# Patient Record
Sex: Female | Born: 1990 | Hispanic: Yes | Marital: Single
Health system: Southern US, Community
[De-identification: ages and names within clinical notes are randomized; demographics above are authoritative.]

## PROBLEM LIST (undated history)

## (undated) HISTORY — PX: APPENDECTOMY: SHX54

## (undated) HISTORY — PX: CHOLECYSTECTOMY: SHX55

---

## 2018-08-26 ENCOUNTER — Encounter (HOSPITAL_COMMUNITY): Payer: Self-pay | Admitting: Emergency Medicine

## 2018-08-26 ENCOUNTER — Emergency Department (HOSPITAL_COMMUNITY): Payer: Medicaid - Out of State

## 2018-08-26 ENCOUNTER — Emergency Department (HOSPITAL_COMMUNITY)
Admission: EM | Admit: 2018-08-26 | Discharge: 2018-08-26 | Disposition: A | Discharge status: Home / Self Care | Payer: Medicaid - Out of State | Attending: Emergency Medicine | Admitting: Emergency Medicine

## 2018-08-26 DIAGNOSIS — R197 Diarrhea, unspecified: Secondary | ICD-10-CM | POA: Insufficient documentation

## 2018-08-26 DIAGNOSIS — R1013 Epigastric pain: Secondary | ICD-10-CM

## 2018-08-26 DIAGNOSIS — R112 Nausea with vomiting, unspecified: Secondary | ICD-10-CM | POA: Diagnosis not present

## 2018-08-26 LAB — COMPREHENSIVE METABOLIC PANEL
ALK PHOS: 92 U/L (ref 38–126)
ALT: 26 U/L (ref 0–44)
AST: 21 U/L (ref 15–41)
Albumin: 4 g/dL (ref 3.5–5.0)
Anion gap: 10 (ref 5–15)
BUN: 13 mg/dL (ref 6–20)
CO2: 18 mmol/L — ABNORMAL LOW (ref 22–32)
Calcium: 8.9 mg/dL (ref 8.9–10.3)
Chloride: 108 mmol/L (ref 98–111)
Creatinine, Ser: 0.78 mg/dL (ref 0.44–1.00)
GFR calc Af Amer: >60 mL/min (ref >60)
GFR calc non Af Amer: >60 mL/min (ref >60)
Glucose, Bld: 134 mg/dL — ABNORMAL HIGH (ref 70–99)
Potassium: 3.8 mmol/L (ref 3.5–5.1)
Sodium: 136 mmol/L (ref 135–145)
Total Bilirubin: 0.5 mg/dL (ref 0.3–1.2)
Total Protein: 7.5 g/dL (ref 6.5–8.1)

## 2018-08-26 LAB — CBC
HCT: 39.6 % (ref 36.0–46.0)
Hemoglobin: 11.6 g/dL — ABNORMAL LOW (ref 12.0–15.0)
MCH: 22 pg — AB (ref 26.0–34.0)
MCHC: 29.3 g/dL — ABNORMAL LOW (ref 30.0–36.0)
MCV: 75 fL — AB (ref 80.0–100.0)
NRBC: 0 % (ref 0.0–0.2)
Platelets: 366 10*3/uL (ref 150–400)
RBC: 5.28 MIL/uL — ABNORMAL HIGH (ref 3.87–5.11)
RDW: 17 % — ABNORMAL HIGH (ref 11.5–15.5)
WBC: 14.9 10*3/uL — ABNORMAL HIGH (ref 4.0–10.5)

## 2018-08-26 LAB — URINALYSIS, ROUTINE W REFLEX MICROSCOPIC
Bilirubin Urine: NEGATIVE
Glucose, UA: NEGATIVE mg/dL
Ketones, ur: NEGATIVE mg/dL
LEUKOCYTES UA: NEGATIVE
Nitrite: NEGATIVE
PH: 5 (ref 5.0–8.0)
Protein, ur: NEGATIVE mg/dL
Specific Gravity, Urine: 1.021 (ref 1.005–1.030)

## 2018-08-26 LAB — I-STAT BETA HCG BLOOD, ED (MC, WL, AP ONLY): I-stat hCG, quantitative: <5 m[IU]/mL (ref <5)

## 2018-08-26 LAB — LIPASE, BLOOD: Lipase: 30 U/L (ref 11–51)

## 2018-08-26 MED ORDER — IOPAMIDOL (ISOVUE-300) INJECTION 61%
INTRAVENOUS | Status: AC
  Administered 2018-08-26: 10:00:00
  Filled 2018-08-26: qty 100

## 2018-08-26 MED ORDER — SODIUM CHLORIDE (PF) 0.9 % IJ SOLN
INTRAMUSCULAR | Status: AC
  Administered 2018-08-26: 09:00:00
  Filled 2018-08-26: qty 50

## 2018-08-26 MED ORDER — KETOROLAC TROMETHAMINE 30 MG/ML IJ SOLN
30.0000 mg | Freq: Once | INTRAMUSCULAR | Status: AC
  Administered 2018-08-26: 30 mg via INTRAVENOUS
  Filled 2018-08-26: qty 1

## 2018-08-26 MED ORDER — METOCLOPRAMIDE HCL 5 MG/ML IJ SOLN: 10.0000 mg | Freq: Once | INTRAMUSCULAR | Status: DC

## 2018-08-26 MED ORDER — SODIUM CHLORIDE 0.9 % IV BOLUS
1000.0000 mL | Freq: Once | INTRAVENOUS | Status: AC
  Administered 2018-08-26: 1000 mL via INTRAVENOUS

## 2018-08-26 MED ORDER — ONDANSETRON 4 MG PO TBDP: 4.0000 mg | ORAL_TABLET | Freq: Three times a day (TID) | ORAL | 0 refills | Status: AC | PRN

## 2018-08-26 MED ORDER — IOPAMIDOL (ISOVUE-300) INJECTION 61%
100.0000 mL | Freq: Once | INTRAVENOUS | Status: AC | PRN
  Administered 2018-08-26: 100 mL via INTRAVENOUS

## 2018-08-26 MED ORDER — ONDANSETRON HCL 4 MG/2ML IJ SOLN
4.0000 mg | Freq: Once | INTRAMUSCULAR | Status: AC
  Administered 2018-08-26: 4 mg via INTRAVENOUS
  Filled 2018-08-26: qty 2

## 2018-08-26 MED ORDER — DICYCLOMINE HCL 20 MG PO TABS: 20.0000 mg | ORAL_TABLET | Freq: Three times a day (TID) | ORAL | 0 refills | Status: AC | PRN

## 2018-08-26 NOTE — Discharge Instructions (Signed)

## 2018-08-26 NOTE — ED Triage Notes (Signed)
Pt c/o abd pains with diarrhea that started yesterday. Today started vomiting. Pt denies urinary problems.

## 2018-08-26 NOTE — ED Provider Notes (Signed)
Emergency Department Provider Note   I have reviewed the triage vital signs and the nursing notes.   HISTORY  Chief Complaint Abdominal Pain; Diarrhea; and Emesis   HPI Kristy Webb is a 27 y.o. female with PMH of lap chole and appendectomy presents to the emergency department with acute onset abdominal pain, diarrhea, vomiting.  Symptoms began yesterday with primarily epigastric abdominal pain radiating to the back.  Patient describes watery, nonbloody diarrhea which occurred for many episodes.  States that the pain has continued but the diarrhea has stopped.  She has since developed nausea and vomiting with worsening abdominal pain overnight.  She continues to pass flatus.  She is visiting from out of town and staying with family.  No other individuals at home are sick with similar symptoms.    History reviewed. No pertinent past medical history.  There are no active problems to display for this patient.   Past Surgical History:  Procedure Laterality Date  . APPENDECTOMY    . CESAREAN SECTION    . CHOLECYSTECTOMY      Allergies Patient has no known allergies.  No family history on file.  Social History Social History   Tobacco Use  . Smoking status: Unknown If Ever Smoked  . Smokeless tobacco: Never Used  Substance Use Topics  . Alcohol use: Not on file  . Drug use: Not on file    Review of Systems  Constitutional: No fever/chills Eyes: No visual changes. ENT: No sore throat. Cardiovascular: Denies chest pain. Respiratory: Denies shortness of breath. Gastrointestinal: Positive abdominal pain. Positive nausea, vomiting, and diarrhea.  No constipation. Genitourinary: Negative for dysuria. Musculoskeletal: Negative for back pain. Skin: Negative for rash. Neurological: Negative for headaches, focal weakness or numbness.  10-point ROS otherwise negative.  ____________________________________________   PHYSICAL EXAM:  VITAL SIGNS: ED Triage  Vitals [08/26/18 0704]  Enc Vitals Group     BP (!) 166/95     Pulse Rate (!) 107     Resp 18     Temp 97.9 F (36.6 C)     Temp Source Oral     SpO2 100 %   Constitutional: Alert and oriented. Patient appears uncomfortable and is shifting frequently in the bed.  Eyes: Conjunctivae are normal.  Head: Atraumatic. Nose: No congestion/rhinnorhea. Mouth/Throat: Mucous membranes are moist. Neck: No stridor.   Cardiovascular: Normal rate, regular rhythm. Good peripheral circulation. Grossly normal heart sounds.   Respiratory: Normal respiratory effort.  No retractions. Lungs CTAB. Gastrointestinal: Soft with focal epigastric tenderness. No rebound or guarding. No lower abdominal discomfort. No distention.  Musculoskeletal: No lower extremity tenderness nor edema. No gross deformities of extremities. Neurologic:  Normal speech and language. No gross focal neurologic deficits are appreciated.  Skin:  Skin is warm, dry and intact. No rash noted.  ____________________________________________   LABS (all labs ordered are listed, but only abnormal results are displayed)  Labs Reviewed  COMPREHENSIVE METABOLIC PANEL - Abnormal; Notable for the following components:      Result Value   CO2 18 (*)    Glucose, Bld 134 (*)    All other components within normal limits  CBC - Abnormal; Notable for the following components:   WBC 14.9 (*)    RBC 5.28 (*)    Hemoglobin 11.6 (*)    MCV 75.0 (*)    MCH 22.0 (*)    MCHC 29.3 (*)    RDW 17.0 (*)    All other components within normal limits  URINALYSIS,  ROUTINE W REFLEX MICROSCOPIC - Abnormal; Notable for the following components:   Hgb urine dipstick SMALL (*)    Bacteria, UA RARE (*)    All other components within normal limits  LIPASE, BLOOD  I-STAT BETA HCG BLOOD, ED ([facility], [facility], [facility] ONLY)   ____________________________________________  RADIOLOGY  Ct Abdomen Pelvis W Contrast  Result Date: 08/26/2018 CLINICAL DATA:  Abdominal pain and  diarrhea EXAM: CT ABDOMEN AND PELVIS WITH CONTRAST TECHNIQUE: Multidetector CT imaging of the abdomen and pelvis was performed using the standard protocol following bolus administration of intravenous contrast. CONTRAST:  100mL ISOVUE-300 IOPAMIDOL (ISOVUE-300) INJECTION 61% COMPARISON:  None. FINDINGS: Lower chest: There is mild atelectatic change in the anterior lung bases. Lung bases otherwise are clear. Hepatobiliary: No focal liver lesions are appreciable. Gallbladder is absent. There is no appreciable biliary duct dilatation. Pancreas: No pancreatic mass or inflammatory focus. Spleen: No splenic lesions are evident. Adrenals/Urinary Tract: Adrenals bilaterally appear normal. Kidneys bilaterally show no evident mass or hydronephrosis on either side. There is no evident renal or ureteral calculus on either side. Urinary bladder is midline with wall thickness mildly increased. Stomach/Bowel: There is no appreciable bowel wall or mesenteric thickening. There is no evident bowel obstruction. No free air or portal venous air. Vascular/Lymphatic: There is no abdominal aortic aneurysm. No vascular lesions are appreciable. There is no evident adenopathy in the abdomen or pelvis. Reproductive: The uterus is anteverted. No pelvic mass is demonstrable on this study. Other: Appendix appears unremarkable. There is no abscess or ascites in the abdomen or pelvis. There is a small ventral hernia containing only fat. Musculoskeletal: There are no blastic or lytic bone lesions. There is no intramuscular lesion evident. IMPRESSION: 1. Urinary bladder wall is felt to be mildly thickened. Question a degree of cystitis. Correlation with urinalysis in this regard advised. 2.  No evident renal or ureteral calculus.  No hydronephrosis. 3. No bowel obstruction. No abscess in the abdomen or pelvis. Appendix appears normal. 4.  Gallbladder absent. 5.  Small ventral hernia containing only fat. Electronically Signed   By: Bretta BangWilliam  Woodruff  III M.D.   On: 08/26/2018 09:10    ____________________________________________   PROCEDURES  Procedure(s) performed:   Procedures  None ____________________________________________   INITIAL IMPRESSION / ASSESSMENT AND PLAN / ED COURSE  Pertinent labs & imaging results that were available during my care of the patient were reviewed by me and considered in my medical decision making (see chart for details).  Patient presents to the emergency department with epigastric abdominal pain, nausea/vomiting along with diarrhea.  Abdominal exam is notable for tenderness in the epigastric region.  Patient is tearful at times and appears very uncomfortable.  Suspect infectious GI process but given her acute presentation and tenderness on exam I do plan to obtain a CT scan of the abdomen and pelvis.   CT imaging and labs reviewed. No acute electrolyte abnormality. CT without acute findings. Plan for symptoms mgmt, PO hydration, and PCP follow up. Discussed ED return precautions.  ____________________________________________  FINAL CLINICAL IMPRESSION(S) / ED DIAGNOSES  Final diagnoses:  Epigastric abdominal pain  Nausea vomiting and diarrhea     MEDICATIONS GIVEN DURING THIS VISIT:  Medications  sodium chloride 0.9 % bolus 1,000 mL (0 mLs Intravenous Stopped 08/26/18 0843)  ondansetron (ZOFRAN) injection 4 mg (4 mg Intravenous Given 08/26/18 0745)  ketorolac (TORADOL) 30 MG/ML injection 30 mg (30 mg Intravenous Given 08/26/18 0744)  iopamidol (ISOVUE-300) 61 % injection 100 mL (100 mLs Intravenous Contrast  Given 08/26/18 0856)  iopamidol (ISOVUE-300) 61 % injection (  Contrast Given 08/26/18 0930)  sodium chloride (PF) 0.9 % injection (  Contrast Given 08/26/18 0929)     NEW OUTPATIENT MEDICATIONS STARTED DURING THIS VISIT:  Discharge Medication List as of 08/26/2018  9:56 AM    START taking these medications   Details  ondansetron (ZOFRAN ODT) 4 MG disintegrating tablet Take  1 tablet (4 mg total) by mouth every 8 (eight) hours as needed for nausea or vomiting., Starting Fri 08/26/2018, Print        Note:  This document was prepared using Dragon voice recognition software and may include unintentional dictation errors.  Alona Bene, MD Emergency Medicine    Darick Fetters, Arlyss Repress, MD 08/26/18 [phone]

## 2020-01-13 IMAGING — CT CT ABD-PELV W/ CM
2 of 4 series · 16 of 46 positions shown, 18 images · IV contrast (ISOVUE)
Comparison: None.

CLINICAL DATA: Abdominal pain and diarrhea

EXAM:
CT ABDOMEN AND PELVIS WITH CONTRAST
TECHNIQUE: Multidetector CT imaging of the abdomen and pelvis was performed
using the standard protocol following bolus administration of
intravenous contrast.
CONTRAST:  100mL CZXEO2-MFF IOPAMIDOL (CZXEO2-MFF) INJECTION 61%

[Series 2: axial st · axial · 0.79mm/px · z∈[+852,+1282]mm · 13 of 98 slices shown, 15 images]
[im 6/98  soft-tissue]
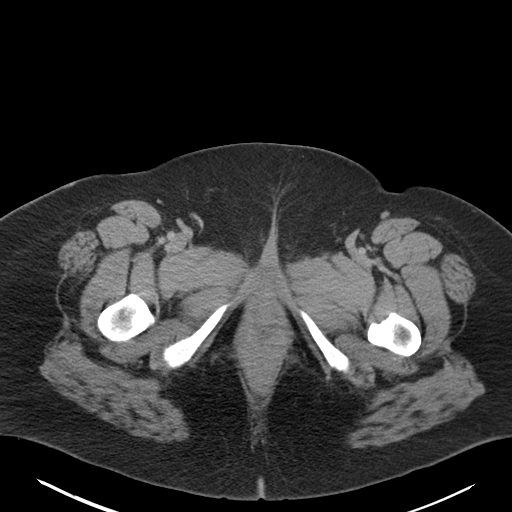
[im 6/98  bone]
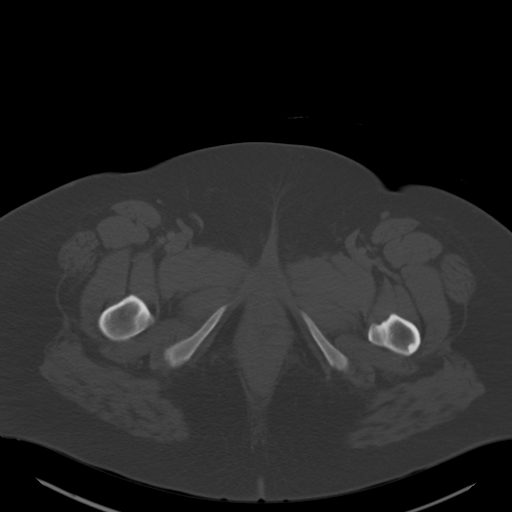
[im 12/98  soft-tissue]
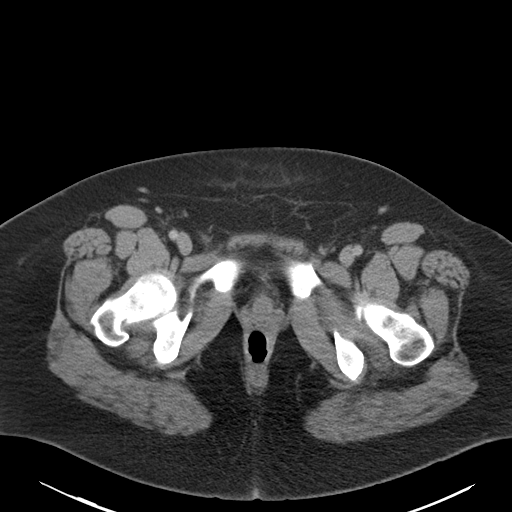
[im 23/98  soft-tissue]
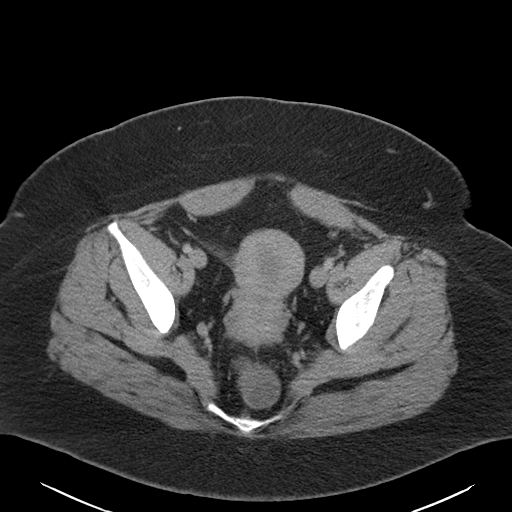
[im 29/98  soft-tissue]
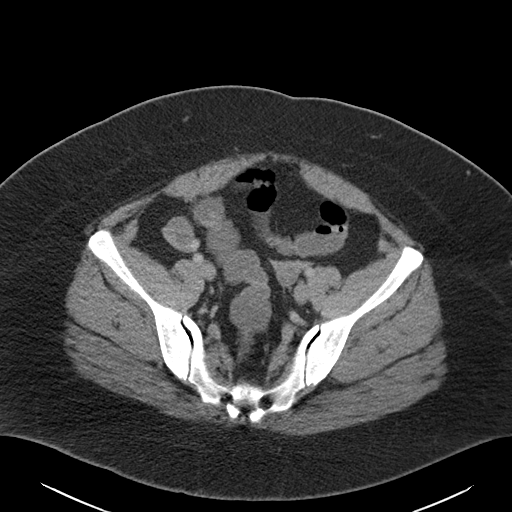
[im 35/98  soft-tissue]
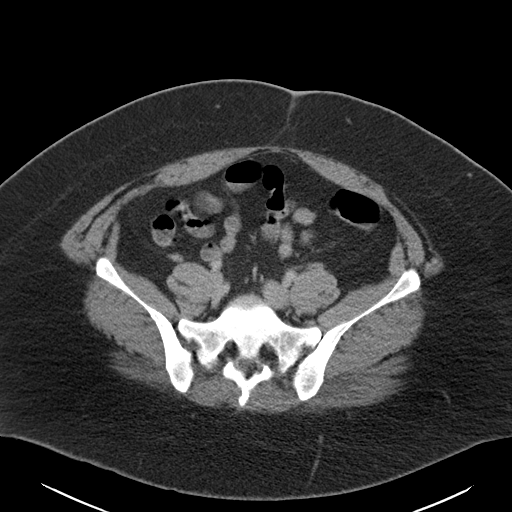
[im 40/98  soft-tissue]
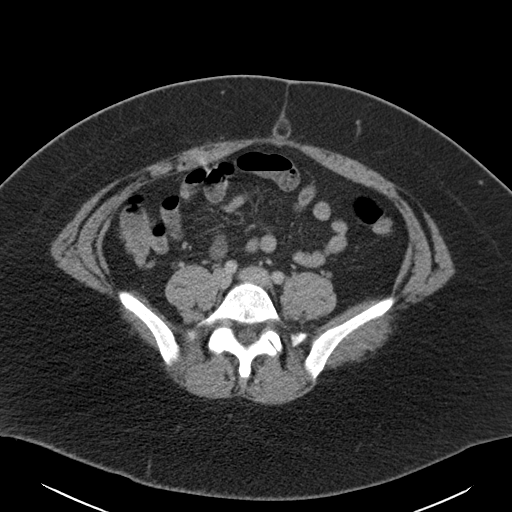
[im 52/98  soft-tissue]
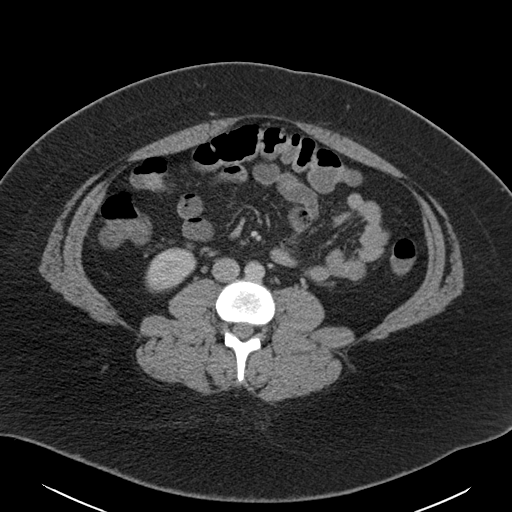
[im 58/98  soft-tissue]
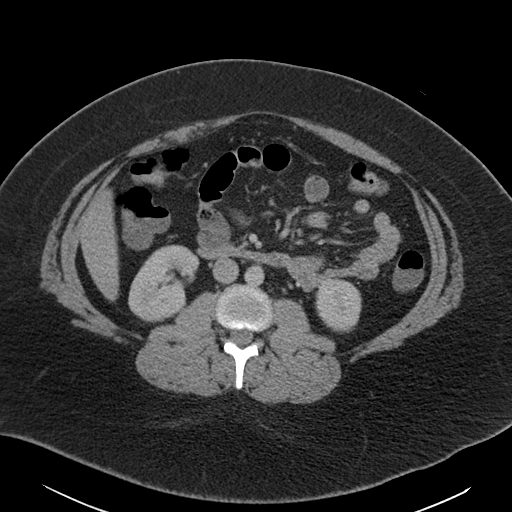
[im 63/98  soft-tissue]
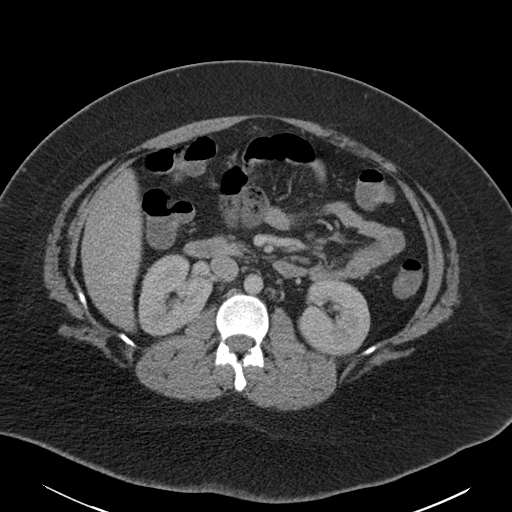
[im 63/98  bone]
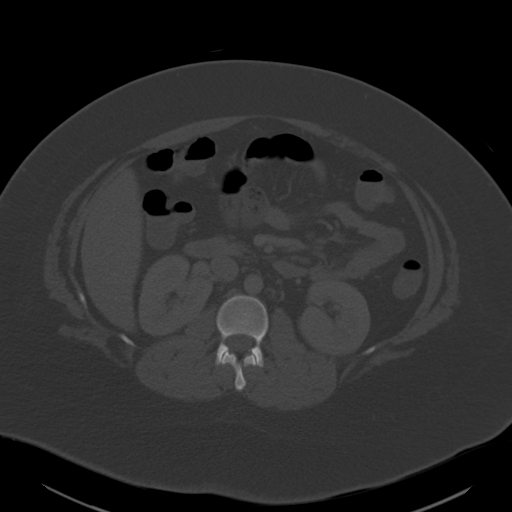
[im 69/98  soft-tissue]
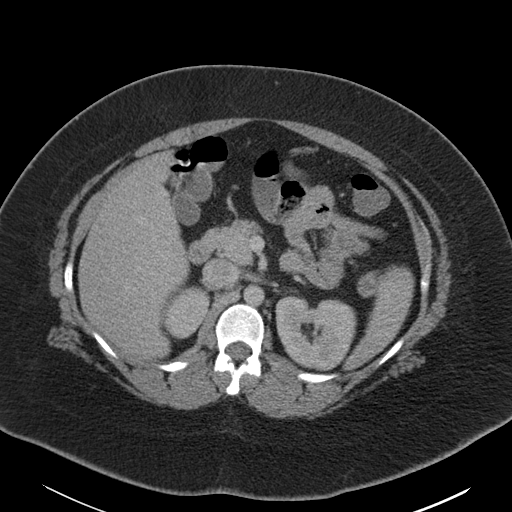
[im 75/98  soft-tissue]
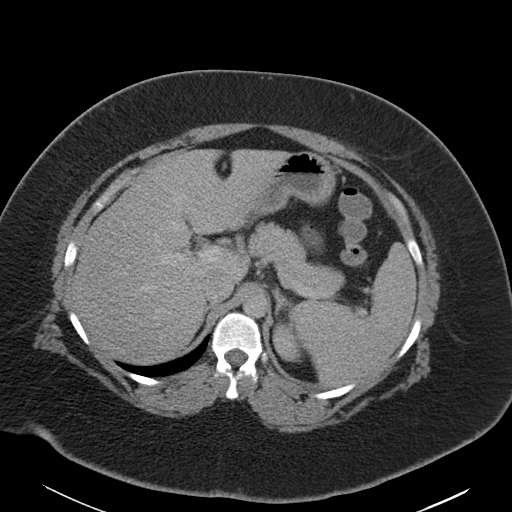
[im 86/98  soft-tissue]
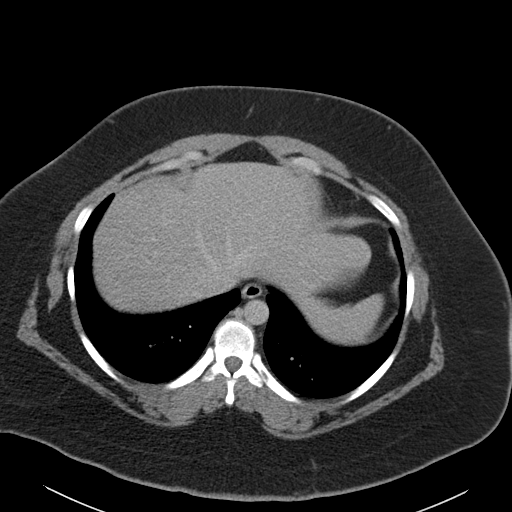
[im 92/98  soft-tissue]
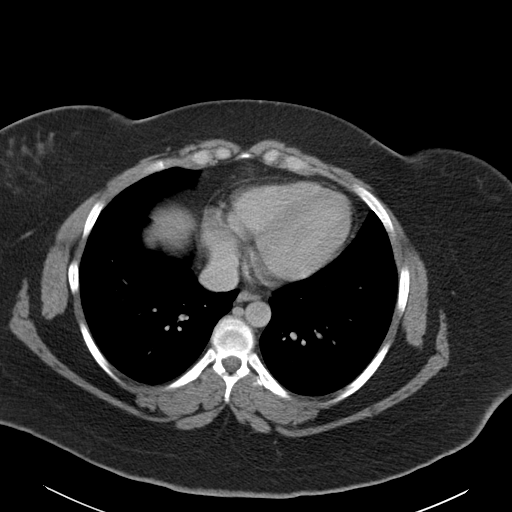

[Series 4: coronal st · coronal · 0.99mm/px · 3 of 84 slices shown]
[im 28/84  soft-tissue]
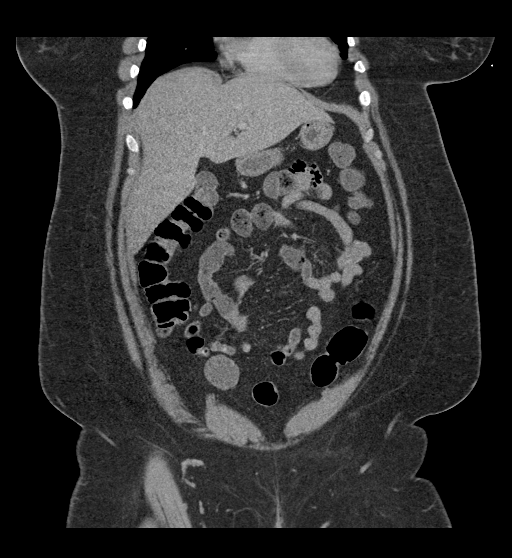
[im 37/84  soft-tissue]
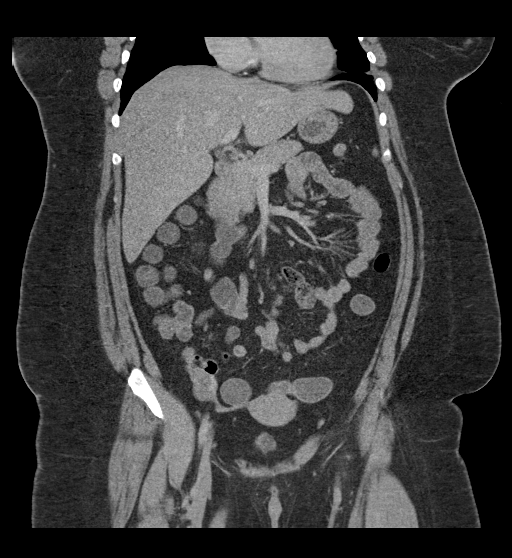
[im 47/84  soft-tissue]
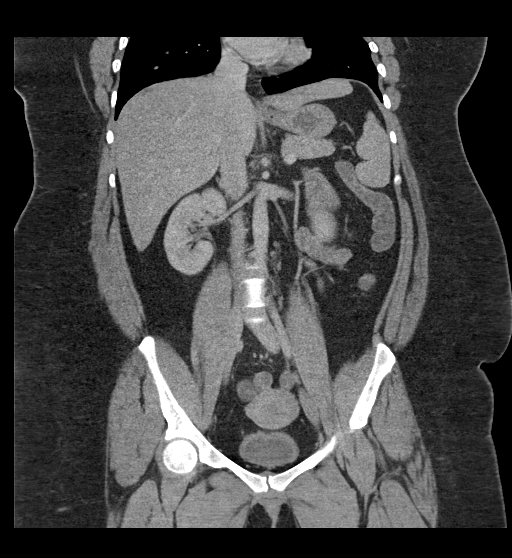

[16 of 46 positions shown; findings below may reference images not displayed]

FINDINGS: Lower chest: There is mild atelectatic change in the anterior lung
bases. Lung bases otherwise are clear.

Hepatobiliary: No focal liver lesions are appreciable. Gallbladder
is absent. There is no appreciable biliary duct dilatation.

Pancreas: No pancreatic mass or inflammatory focus.

Spleen: No splenic lesions are evident.

Adrenals/Urinary Tract: Adrenals bilaterally appear normal. Kidneys
bilaterally show no evident mass or hydronephrosis on either side.
There is no evident renal or ureteral calculus on either side.
Urinary bladder is midline with wall thickness mildly increased.

Stomach/Bowel: There is no appreciable bowel wall or mesenteric
thickening. There is no evident bowel obstruction. No free air or
portal venous air.

Vascular/Lymphatic: There is no abdominal aortic aneurysm. No
vascular lesions are appreciable. There is no evident adenopathy in
the abdomen or pelvis.

Reproductive: The uterus is anteverted. No pelvic mass is
demonstrable on this study.

Other: Appendix appears unremarkable. There is no abscess or ascites
in the abdomen or pelvis. There is a small ventral hernia containing
only fat.

Musculoskeletal: There are no blastic or lytic bone lesions. There
is no intramuscular lesion evident.
IMPRESSION: 1. Urinary bladder wall is felt to be mildly thickened. Question a
degree of cystitis. Correlation with urinalysis in this regard
advised.

2.  No evident renal or ureteral calculus.  No hydronephrosis.

3. No bowel obstruction. No abscess in the abdomen or pelvis.
Appendix appears normal.

4.  Gallbladder absent.

5.  Small ventral hernia containing only fat.
